# Patient Record
Sex: Male | Born: 2000 | Hispanic: No | Marital: Single
Health system: Southern US, Community
[De-identification: ages and names within clinical notes are randomized; demographics above are authoritative.]

## PROBLEM LIST (undated history)

## (undated) DIAGNOSIS — J45909 Unspecified asthma, uncomplicated: Secondary | ICD-10-CM

## (undated) DIAGNOSIS — T7840XA Allergy, unspecified, initial encounter: Secondary | ICD-10-CM

## (undated) HISTORY — DX: Allergy, unspecified, initial encounter: T78.40XA

## (undated) HISTORY — DX: Unspecified asthma, uncomplicated: J45.909

---
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
## 2019-04-07 ENCOUNTER — Emergency Department (HOSPITAL_COMMUNITY)
Admission: EM | Admit: 2019-04-07 | Discharge: 2019-04-08 | Disposition: A | Discharge status: Home / Self Care | Payer: Medicaid Other | Attending: Emergency Medicine | Admitting: Emergency Medicine

## 2019-04-07 ENCOUNTER — Other Ambulatory Visit: Payer: Self-pay

## 2019-04-07 ENCOUNTER — Encounter (HOSPITAL_COMMUNITY): Payer: Self-pay

## 2019-04-07 DIAGNOSIS — Z79899 Other long term (current) drug therapy: Secondary | ICD-10-CM | POA: Diagnosis not present

## 2019-04-07 DIAGNOSIS — J45909 Unspecified asthma, uncomplicated: Secondary | ICD-10-CM | POA: Diagnosis not present

## 2019-04-07 DIAGNOSIS — Z7722 Contact with and (suspected) exposure to environmental tobacco smoke (acute) (chronic): Secondary | ICD-10-CM | POA: Insufficient documentation

## 2019-04-07 DIAGNOSIS — Z202 Contact with and (suspected) exposure to infections with a predominantly sexual mode of transmission: Secondary | ICD-10-CM | POA: Insufficient documentation

## 2019-04-07 LAB — URINALYSIS, ROUTINE W REFLEX MICROSCOPIC
Bacteria, UA: NONE SEEN
Bilirubin Urine: NEGATIVE
Glucose, UA: NEGATIVE mg/dL
Hgb urine dipstick: NEGATIVE
Ketones, ur: 5 mg/dL — AB
Nitrite: NEGATIVE
Protein, ur: 30 mg/dL — AB
Specific Gravity, Urine: 1.028 (ref 1.005–1.030)
pH: 6 (ref 5.0–8.0)

## 2019-04-07 NOTE — ED Triage Notes (Signed)
Pt states his gf has chlamydia and he wants to be checked

## 2019-04-08 MED ORDER — LIDOCAINE HCL (PF) 1 % IJ SOLN
INTRAMUSCULAR | Status: AC
  Administered 2019-04-08: 0.9 mL
  Filled 2019-04-08: qty 5

## 2019-04-08 MED ORDER — CEFTRIAXONE SODIUM 250 MG IJ SOLR
250.0000 mg | Freq: Once | INTRAMUSCULAR | Status: AC
  Administered 2019-04-08: 250 mg via INTRAMUSCULAR
  Filled 2019-04-08: qty 250

## 2019-04-08 MED ORDER — AZITHROMYCIN 1 G PO PACK
1.0000 g | PACK | Freq: Once | ORAL | Status: AC
  Administered 2019-04-08: 1 g via ORAL
  Filled 2019-04-08: qty 1

## 2019-04-08 NOTE — Discharge Instructions (Signed)
No sexual intercourse for at least 1 week. Please use protection and choose wisely.

## 2019-04-08 NOTE — ED Provider Notes (Signed)
[facility]   CSN: [id number] Arrival date & time: 04/07/19  2151     History   Chief Complaint Chief Complaint  Patient presents with  . Exposure to STD    HPI Brian Kidd is a 18 y.o. male.     Patient with history of asthma presents after his girlfriend was informed she had chlamydia from testing.  Patient denies symptoms at this time.  No history of STDs.  Patient has been with this partner for for significant time.  Male partners.     Past Medical History:  Diagnosis Date  . Allergy   . Asthma     Patient Active Problem List   Diagnosis Date Noted  . Moderate persistent asthma with acute exacerbation 06/19/2014  . Rhinitis, allergic 06/19/2014    History reviewed. No pertinent surgical history.      Home Medications    Prior to Admission medications   Medication Sig Start Date End Date Taking? Authorizing Provider  albuterol (PROVENTIL HFA;VENTOLIN HFA) 108 (90 BASE) MCG/ACT inhaler Inhale 2 puffs into the lungs every 6 (six) hours as needed for wheezing or shortness of breath. 06/19/14   Ettefagh, Aron BabaKate Scott, MD  albuterol (PROVENTIL) (2.5 MG/3ML) 0.083% nebulizer solution Take 3 mLs (2.5 mg total) by nebulization every 4 (four) hours as needed for wheezing or shortness of breath. 06/19/14   Ettefagh, Aron BabaKate Scott, MD  beclomethasone (QVAR) 40 MCG/ACT inhaler Inhale 2 puffs into the lungs 2 (two) times daily. 06/19/14   Ettefagh, Aron BabaKate Scott, MD  cetirizine (ZYRTEC) 10 MG tablet Take 1 tablet (10 mg total) by mouth daily. 06/19/14   Ettefagh, Aron BabaKate Scott, MD  fluticasone (FLONASE) 50 MCG/ACT nasal spray Place 2 sprays into both nostrils daily. 06/19/14   Ettefagh, Aron BabaKate Scott, MD  ibuprofen (ADVIL,MOTRIN) 600 MG tablet Take 1 tablet (600 mg total) by mouth every 6 (six) hours as needed for mild pain or moderate pain. 06/29/16   Lowanda FosterBrewer, Mindy, NP  predniSONE (DELTASONE) 20 MG tablet Take 3 tablets (60 mg total) by  mouth daily with breakfast. For 5 days 06/19/14   Ettefagh, Aron BabaKate Scott, MD    Family History No family history on file.  Social History Social History   Tobacco Use  . Smoking status: Passive Smoke Exposure - Never Smoker  . Smokeless tobacco: Never Used  Substance Use Topics  . Alcohol use: Not on file  . Drug use: Not on file     Allergies   Patient has no known allergies.   Review of Systems Review of Systems  Constitutional: Negative for fever.  Gastrointestinal: Negative for vomiting.  Genitourinary: Negative for discharge, hematuria and penile pain.     Physical Exam Updated Vital Signs BP 130/78 (BP Location: Left Arm)   Pulse 73   Temp 98.4 F (36.9 C) (Oral)   Resp 16   SpO2 100%   Physical Exam Vitals signs and nursing note reviewed.  Constitutional:      Appearance: He is well-developed.  HENT:     Head: Normocephalic.  Eyes:     General:        Right eye: No discharge.        Left eye: No discharge.     Conjunctiva/sclera: Conjunctivae normal.  Neck:     Trachea: No tracheal deviation.  Cardiovascular:     Rate and Rhythm: Normal rate.  Skin:    General: Skin is warm.     Findings: No rash.  Neurological:     Mental Status: He is alert and oriented to person, place, and time.  Psychiatric:        Mood and Affect: Mood normal.      ED Treatments / Results  Labs (all labs ordered are listed, but only abnormal results are displayed) Labs Reviewed  URINALYSIS, ROUTINE W REFLEX MICROSCOPIC - Abnormal; Notable for the following components:      Result Value   Ketones, ur 5 (*)    Protein, ur 30 (*)    Leukocytes,Ua SMALL (*)    All other components within normal limits    EKG None  Radiology No results found.  Procedures Procedures (including critical care time)  Medications Ordered in ED Medications  cefTRIAXone (ROCEPHIN) injection 250 mg (has no administration in time range)  azithromycin (ZITHROMAX) powder 1 g (has no  administration in time range)     Initial Impression / Assessment and Plan / ED Course  I have reviewed the triage vital signs and the nursing notes.  Pertinent labs & imaging results that were available during my care of the patient were reviewed by me and considered in my medical decision making (see chart for details).       Patient presents for treatment of known chlamydia exposure.  Patient well-appearing no signs or symptoms at this time.  Prophylactic antibiotics given, discussed importance of protection and wise decisions.  Recommended no sexual intercourse for at least 1 week.  Final Clinical Impressions(s) / ED Diagnoses   Final diagnoses:  STD exposure    ED Discharge Orders    None       Elnora Morrison, MD 04/08/19 0011

## 2019-04-15 ENCOUNTER — Other Ambulatory Visit: Payer: Self-pay

## 2019-04-15 ENCOUNTER — Emergency Department (HOSPITAL_COMMUNITY)
Admission: EM | Admit: 2019-04-15 | Discharge: 2019-04-15 | Disposition: A | Discharge status: Home / Self Care | Payer: Medicaid Other | Attending: Emergency Medicine | Admitting: Emergency Medicine

## 2019-04-15 ENCOUNTER — Encounter (HOSPITAL_COMMUNITY): Payer: Self-pay | Admitting: Emergency Medicine

## 2019-04-15 DIAGNOSIS — Z7722 Contact with and (suspected) exposure to environmental tobacco smoke (acute) (chronic): Secondary | ICD-10-CM | POA: Insufficient documentation

## 2019-04-15 DIAGNOSIS — Z202 Contact with and (suspected) exposure to infections with a predominantly sexual mode of transmission: Secondary | ICD-10-CM | POA: Diagnosis present

## 2019-04-15 DIAGNOSIS — A749 Chlamydial infection, unspecified: Secondary | ICD-10-CM

## 2019-04-15 DIAGNOSIS — Z8709 Personal history of other diseases of the respiratory system: Secondary | ICD-10-CM | POA: Insufficient documentation

## 2019-04-15 LAB — HIV ANTIBODY (ROUTINE TESTING W REFLEX): HIV Screen 4th Generation wRfx: NONREACTIVE

## 2019-04-15 LAB — RPR: RPR Ser Ql: NONREACTIVE

## 2019-04-15 MED ORDER — AZITHROMYCIN 250 MG PO TABS
1000.0000 mg | ORAL_TABLET | Freq: Once | ORAL | Status: AC
  Administered 2019-04-15: 1000 mg via ORAL
  Filled 2019-04-15: qty 4

## 2019-04-15 MED ORDER — LIDOCAINE HCL (PF) 1 % IJ SOLN
INTRAMUSCULAR | Status: AC
  Filled 2019-04-15: qty 5

## 2019-04-15 MED ORDER — CEFTRIAXONE SODIUM 250 MG IJ SOLR
250.0000 mg | Freq: Once | INTRAMUSCULAR | Status: AC
  Administered 2019-04-15: 05:00:00 250 mg via INTRAMUSCULAR
  Filled 2019-04-15: qty 250

## 2019-04-15 NOTE — ED Triage Notes (Signed)
Pt reports he tested positive for STD, needs treatment.  No symptoms noted

## 2019-04-15 NOTE — Discharge Instructions (Signed)
You have been treated for chlamydia and gonorrhea today.  Both you and your partner need to avoid all sexual intercourse for at least 7 days after both of you have been treated.  We have also tested you for HIV and syphilis.  You can follow-up on these results in my chart.  If they are positive, you will be contacted and your partner will need to be informed.  I recommend that you use condoms every time you are sexually active to prevent STDs and unwanted pregnancy.

## 2019-04-15 NOTE — ED Provider Notes (Signed)
TIME SEEN: 4:07 AM  CHIEF COMPLAINT: Requesting treatment for chlamydia  HPI: Patient is an 18 year old male with history of asthma who presents to the emergency department requesting treatment for chlamydia.  States that he tested positive for chlamydia at the clinic 2 days ago.  He was contacted but did not follow-up with the clinic for treatment.  He is asymptomatic.  He denies abdominal pain, fevers, vomiting, diarrhea, dysuria, hematuria, penile discomfort, testicular pain or scrotal swelling.  States he is sexually active with one male partner.  They do not always use protection.  Patient was seen here on June 10 and was prophylactically treated for gonorrhea and chlamydia after he had a known STD exposure to chlamydia.  He acknowledges that he was told to wait at least 1 week for sexual intercourse but states that they did not wait the full 7 days after both being treated.  States he did not have HIV and syphilis testing at the clinic.  ROS: See HPI Constitutional: no fever  Eyes: no drainage  ENT: no runny nose   Cardiovascular:  no chest pain  Resp: no SOB  GI: no vomiting GU: no dysuria Integumentary: no rash  Allergy: no hives  Musculoskeletal: no leg swelling  Neurological: no slurred speech ROS otherwise negative  PAST MEDICAL HISTORY/PAST SURGICAL HISTORY:  Past Medical History:  Diagnosis Date  . Allergy   . Asthma     MEDICATIONS:  Prior to Admission medications   Medication Sig Start Date End Date Taking? Authorizing Provider  albuterol (PROVENTIL HFA;VENTOLIN HFA) 108 (90 BASE) MCG/ACT inhaler Inhale 2 puffs into the lungs every 6 (six) hours as needed for wheezing or shortness of breath. 06/19/14   Ettefagh, Aron BabaKate Scott, MD  albuterol (PROVENTIL) (2.5 MG/3ML) 0.083% nebulizer solution Take 3 mLs (2.5 mg total) by nebulization every 4 (four) hours as needed for wheezing or shortness of breath. 06/19/14   Ettefagh, Aron BabaKate Scott, MD  beclomethasone (QVAR) 40 MCG/ACT  inhaler Inhale 2 puffs into the lungs 2 (two) times daily. 06/19/14   Ettefagh, Aron BabaKate Scott, MD  cetirizine (ZYRTEC) 10 MG tablet Take 1 tablet (10 mg total) by mouth daily. 06/19/14   Ettefagh, Aron BabaKate Scott, MD  fluticasone (FLONASE) 50 MCG/ACT nasal spray Place 2 sprays into both nostrils daily. 06/19/14   Ettefagh, Aron BabaKate Scott, MD  ibuprofen (ADVIL,MOTRIN) 600 MG tablet Take 1 tablet (600 mg total) by mouth every 6 (six) hours as needed for mild pain or moderate pain. 06/29/16   Lowanda FosterBrewer, Mindy, NP  predniSONE (DELTASONE) 20 MG tablet Take 3 tablets (60 mg total) by mouth daily with breakfast. For 5 days 06/19/14   Ettefagh, Aron BabaKate Scott, MD    ALLERGIES:  No Known Allergies  SOCIAL HISTORY:  Social History   Tobacco Use  . Smoking status: Passive Smoke Exposure - Never Smoker  . Smokeless tobacco: Never Used  Substance Use Topics  . Alcohol use: Not on file    FAMILY HISTORY: No family history on file.  EXAM: BP 127/79 (BP Location: Right Arm)   Pulse 67   Temp 98.5 F (36.9 C) (Oral)   Resp 18   SpO2 97%  CONSTITUTIONAL: Alert and oriented and responds appropriately to questions. Well-appearing; well-nourished HEAD: Normocephalic EYES: Conjunctivae clear, pupils appear equal ENT: normal nose; moist mucous membranes NECK: Supple CARD: RRR RESP: Normal chest excursion without splinting or tachypnea, no hypoxia or respiratory distress, speaking full sentences ABD/GI: Normal bowel sounds; non-distended; soft, non-tender, no rebound, no guarding, no peritoneal signs,  no hepatosplenomegaly GU: Patient deferred BACK:  The back appears normal EXT: Normal ROM in all joints; no major deformity noted SKIN: Normal color for age and race; warm; no rash on exposed skin NEURO: Moves all extremities equally PSYCH: The patient's mood and manner are appropriate. Grooming and personal hygiene are appropriate.  MEDICAL DECISION MAKING: Patient here with STD exposure and positive chlamydia test 2  [facility].  Requesting treatment today.  Discussed with patient given I cannot see those results we will cover him for gonorrhea and chlamydia today.  His girlfriend is currently being seen in the ED for STD exposure as well.  I discussed with him at length that he needs to avoid any sexual intercourse including oral, vaginal or anal intercourse for at least 7 days after both himself and his partner have been treated and that he needs to use condoms on a regular basis.  He states that he already has 1 child and does not want another.  We have had a lengthy discussion regarding safe sex practices.  Have offered HIV and syphilis testing today which she states were not done at the clinic.  He agrees to these blood test.  At this time, I do not feel there is any life-threatening condition present. I have reviewed and discussed all results (EKG, imaging, lab, urine as appropriate) and exam findings with patient/family. I have reviewed nursing notes and appropriate previous records.  I feel the patient is safe to be discharged home without further emergent workup and can continue workup as an outpatient as needed. Discussed usual and customary return precautions. Patient/family verbalize understanding and are comfortable with this plan.  Outpatient follow-up has been provided as needed. All questions have been answered.      Ivan Maskell, Delice Bison, DO 04/15/19 [phone]

## 2019-08-27 ENCOUNTER — Other Ambulatory Visit: Payer: Self-pay

## 2019-08-27 ENCOUNTER — Encounter (HOSPITAL_COMMUNITY): Payer: Self-pay | Admitting: *Deleted

## 2019-08-27 ENCOUNTER — Emergency Department (HOSPITAL_COMMUNITY)
Admission: EM | Admit: 2019-08-27 | Discharge: 2019-08-27 | Disposition: A | Discharge status: Home / Self Care | Payer: Medicaid Other | Attending: Emergency Medicine | Admitting: Emergency Medicine

## 2019-08-27 DIAGNOSIS — Z79899 Other long term (current) drug therapy: Secondary | ICD-10-CM | POA: Diagnosis not present

## 2019-08-27 DIAGNOSIS — Z202 Contact with and (suspected) exposure to infections with a predominantly sexual mode of transmission: Secondary | ICD-10-CM

## 2019-08-27 DIAGNOSIS — Z7722 Contact with and (suspected) exposure to environmental tobacco smoke (acute) (chronic): Secondary | ICD-10-CM | POA: Insufficient documentation

## 2019-08-27 DIAGNOSIS — J45909 Unspecified asthma, uncomplicated: Secondary | ICD-10-CM | POA: Insufficient documentation

## 2019-08-27 MED ORDER — CEFTRIAXONE SODIUM 250 MG IJ SOLR
250.0000 mg | Freq: Once | INTRAMUSCULAR | Status: AC
  Administered 2019-08-27: 250 mg via INTRAMUSCULAR
  Filled 2019-08-27: qty 250

## 2019-08-27 MED ORDER — AZITHROMYCIN 250 MG PO TABS
1000.0000 mg | ORAL_TABLET | Freq: Once | ORAL | Status: AC
  Administered 2019-08-27: 1000 mg via ORAL
  Filled 2019-08-27: qty 4

## 2019-08-27 NOTE — ED Notes (Signed)
Pt sitting texting on his phone  As I triaged him

## 2019-08-27 NOTE — ED Provider Notes (Signed)
[facility] EMERGENCY DEPARTMENT Provider Note   CSN: [id number] Arrival date & time: 08/27/19  1543     History   Chief Complaint Chief Complaint  Patient presents with  . Exposure to STD    HPI Brian Kidd is a 18 y.o. male.     The history is provided by the patient. No language interpreter was used.  Exposure to STD This is a new problem. The problem occurs constantly. The problem has been gradually worsening. Pertinent negatives include no abdominal pain. Nothing aggravates the symptoms. Nothing relieves the symptoms. He has tried nothing for the symptoms. The treatment provided no relief.   Pt reports his partner tested positive for chlamydia.  Pt was told he needed treatment  Past Medical History:  Diagnosis Date  . Allergy   . Asthma     Patient Active Problem List   Diagnosis Date Noted  . Moderate persistent asthma with acute exacerbation 06/19/2014  . Rhinitis, allergic 06/19/2014    History reviewed. No pertinent surgical history.      Home Medications    Prior to Admission medications   Medication Sig Start Date End Date Taking? Authorizing Provider  albuterol (PROVENTIL HFA;VENTOLIN HFA) 108 (90 BASE) MCG/ACT inhaler Inhale 2 puffs into the lungs every 6 (six) hours as needed for wheezing or shortness of breath. 06/19/14   Ettefagh, Aron Baba, MD  albuterol (PROVENTIL) (2.5 MG/3ML) 0.083% nebulizer solution Take 3 mLs (2.5 mg total) by nebulization every 4 (four) hours as needed for wheezing or shortness of breath. 06/19/14   Ettefagh, Aron Baba, MD  beclomethasone (QVAR) 40 MCG/ACT inhaler Inhale 2 puffs into the lungs 2 (two) times daily. 06/19/14   Ettefagh, Aron Baba, MD  cetirizine (ZYRTEC) 10 MG tablet Take 1 tablet (10 mg total) by mouth daily. 06/19/14   Ettefagh, Aron Baba, MD  fluticasone (FLONASE) 50 MCG/ACT nasal spray Place 2 sprays into both nostrils daily. 06/19/14   Ettefagh, Aron Baba, MD  ibuprofen (ADVIL,MOTRIN)  600 MG tablet Take 1 tablet (600 mg total) by mouth every 6 (six) hours as needed for mild pain or moderate pain. 06/29/16   Lowanda Foster, NP  predniSONE (DELTASONE) 20 MG tablet Take 3 tablets (60 mg total) by mouth daily with breakfast. For 5 days 06/19/14   Ettefagh, Aron Baba, MD    Family History No family history on file.  Social History Social History   Tobacco Use  . Smoking status: Passive Smoke Exposure - Never Smoker  . Smokeless tobacco: Never Used  Substance Use Topics  . Alcohol use: Never    Frequency: Never  . Drug use: Yes    Types: Marijuana     Allergies   Patient has no known allergies.   Review of Systems Review of Systems  Gastrointestinal: Negative for abdominal pain.  All other systems reviewed and are negative.    Physical Exam Updated Vital Signs BP 125/83   Pulse 75   Temp 98.1 F (36.7 C) (Oral)   Resp 16   Ht 6\' 3"  (1.905 m)   Wt 77.1 kg   SpO2 99%   BMI 21.25 kg/m   Physical Exam Vitals signs and nursing note reviewed.  Constitutional:      Appearance: He is well-developed.  HENT:     Head: Normocephalic and atraumatic.  Neck:     Musculoskeletal: Neck supple.  Cardiovascular:     Rate and Rhythm: Normal rate.     Heart sounds: No murmur.  Pulmonary:  Effort: Pulmonary effort is normal. No respiratory distress.  Abdominal:     Tenderness: There is no abdominal tenderness.  Skin:    General: Skin is warm and dry.  Neurological:     General: No focal deficit present.     Mental Status: He is alert.  Psychiatric:        Mood and Affect: Mood normal.      ED Treatments / Results  Labs (all labs ordered are listed, but only abnormal results are displayed) Labs Reviewed  URINE CYTOLOGY ANCILLARY ONLY    EKG None  Radiology No results found.  Procedures Procedures (including critical care time)  Medications Ordered in ED Medications  cefTRIAXone (ROCEPHIN) injection 250 mg (has no administration in time  range)  azithromycin (ZITHROMAX) tablet 1,000 mg (has no administration in time range)     Initial Impression / Assessment and Plan / ED Course  I have reviewed the triage vital signs and the nursing notes.  Pertinent labs & imaging results that were available during my care of the patient were reviewed by me and considered in my medical decision making (see chart for details).        MDM  Pt counseled on safe sex.  Pt given zithromax and Rocephin.    Final Clinical Impressions(s) / ED Diagnoses   Final diagnoses:  STD exposure    ED Discharge Orders    None    An After Visit Summary was printed and given to the patient.    Fransico Meadow, Hershal Coria 08/27/19 1649    Blanchie Dessert, MD 08/29/19 2126

## 2019-08-27 NOTE — Discharge Instructions (Signed)
[facility] the [facility]
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
## 2019-08-29 LAB — URINE CYTOLOGY ANCILLARY ONLY
Chlamydia: NEGATIVE
Neisseria Gonorrhea: NEGATIVE

## 2019-09-01 ENCOUNTER — Other Ambulatory Visit: Payer: Self-pay

## 2019-09-01 ENCOUNTER — Other Ambulatory Visit: Payer: Self-pay | Admitting: Physician Assistant

## 2019-09-01 ENCOUNTER — Emergency Department (HOSPITAL_COMMUNITY)
Admission: EM | Admit: 2019-09-01 | Discharge: 2019-09-01 | Disposition: A | Discharge status: Home / Self Care | Payer: Medicaid Other | Attending: Emergency Medicine | Admitting: Emergency Medicine

## 2019-09-01 ENCOUNTER — Encounter (HOSPITAL_COMMUNITY): Payer: Self-pay | Admitting: Emergency Medicine

## 2019-09-01 DIAGNOSIS — Z7689 Persons encountering health services in other specified circumstances: Secondary | ICD-10-CM

## 2019-09-01 DIAGNOSIS — Z202 Contact with and (suspected) exposure to infections with a predominantly sexual mode of transmission: Secondary | ICD-10-CM | POA: Diagnosis not present

## 2019-09-01 DIAGNOSIS — Z7722 Contact with and (suspected) exposure to environmental tobacco smoke (acute) (chronic): Secondary | ICD-10-CM | POA: Insufficient documentation

## 2019-09-01 LAB — URINALYSIS, ROUTINE W REFLEX MICROSCOPIC
Bilirubin Urine: NEGATIVE
Glucose, UA: NEGATIVE mg/dL
Hgb urine dipstick: NEGATIVE
Ketones, ur: NEGATIVE mg/dL
Leukocytes,Ua: NEGATIVE
Nitrite: NEGATIVE
Protein, ur: NEGATIVE mg/dL
Specific Gravity, Urine: 1.025 (ref 1.005–1.030)
pH: 6 (ref 5.0–8.0)

## 2019-09-01 MED ORDER — LIDOCAINE HCL (PF) 1 % IJ SOLN
5.0000 mL | Freq: Once | INTRAMUSCULAR | Status: AC
  Administered 2019-09-01: 5 mL
  Filled 2019-09-01: qty 5

## 2019-09-01 MED ORDER — AZITHROMYCIN 250 MG PO TABS
1000.0000 mg | ORAL_TABLET | Freq: Once | ORAL | Status: AC
  Administered 2019-09-01: 23:00:00 1000 mg via ORAL
  Filled 2019-09-01: qty 4

## 2019-09-01 MED ORDER — METRONIDAZOLE 500 MG PO TABS
2000.0000 mg | ORAL_TABLET | Freq: Once | ORAL | Status: AC
  Administered 2019-09-01: 2000 mg via ORAL
  Filled 2019-09-01: qty 4

## 2019-09-01 MED ORDER — CEFTRIAXONE SODIUM 250 MG IJ SOLR
250.0000 mg | Freq: Once | INTRAMUSCULAR | Status: AC
  Administered 2019-09-01: 23:00:00 250 mg via INTRAMUSCULAR
  Filled 2019-09-01: qty 250

## 2019-09-01 NOTE — ED Notes (Signed)
Patient Alert and oriented to baseline. Stable and ambulatory to baseline. Patient verbalized understanding of the discharge instructions.  Patient belongings were taken by the patient.   

## 2019-09-01 NOTE — ED Provider Notes (Signed)
[facility] EMERGENCY DEPARTMENT Provider Note   CSN: [id number] Arrival date & time: 09/01/19  2145     History   Chief Complaint Chief Complaint  Patient presents with   STD Screening    HPI Brian Kidd is a 18 y.o. male.     HPI   Patient is a 18 year old male with a history of allergies, asthma, who presents the emergency department today for evaluation of exposure to sexually transmitted infection.  States that his sexual partner was recently diagnosed with chlamydia.  He was here in the ED 3 days ago and received treatment however he had intercourse with this person again and did not use protection.  He is asymptomatic at this time and denies any penile discharge, dysuria, frequency, testicular pain, swelling, redness, penile lesions, abdominal pain, nausea, vomiting or fevers.  Past Medical History:  Diagnosis Date   Allergy    Asthma     Patient Active Problem List   Diagnosis Date Noted   Moderate persistent asthma with acute exacerbation 06/19/2014   Rhinitis, allergic 06/19/2014    History reviewed. No pertinent surgical history.      Home Medications    Prior to Admission medications   Medication Sig Start Date End Date Taking? Authorizing Provider  albuterol (PROVENTIL HFA;VENTOLIN HFA) 108 (90 BASE) MCG/ACT inhaler Inhale 2 puffs into the lungs every 6 (six) hours as needed for wheezing or shortness of breath. 06/19/14   Ettefagh, Paul Dykes, MD  albuterol (PROVENTIL) (2.5 MG/3ML) 0.083% nebulizer solution Take 3 mLs (2.5 mg total) by nebulization every 4 (four) hours as needed for wheezing or shortness of breath. 06/19/14   Ettefagh, Paul Dykes, MD  beclomethasone (QVAR) 40 MCG/ACT inhaler Inhale 2 puffs into the lungs 2 (two) times daily. 06/19/14   Ettefagh, Paul Dykes, MD  cetirizine (ZYRTEC) 10 MG tablet Take 1 tablet (10 mg total) by mouth daily. 06/19/14   Ettefagh, Paul Dykes, MD  fluticasone (FLONASE) 50 MCG/ACT nasal spray  Place 2 sprays into both nostrils daily. 06/19/14   Ettefagh, Paul Dykes, MD  ibuprofen (ADVIL,MOTRIN) 600 MG tablet Take 1 tablet (600 mg total) by mouth every 6 (six) hours as needed for mild pain or moderate pain. 06/29/16   Kristen Cardinal, NP  predniSONE (DELTASONE) 20 MG tablet Take 3 tablets (60 mg total) by mouth daily with breakfast. For 5 days 06/19/14   Ettefagh, Paul Dykes, MD    Family History No family history on file.  Social History Social History   Tobacco Use   Smoking status: Passive Smoke Exposure - Never Smoker   Smokeless tobacco: Never Used  Substance Use Topics   Alcohol use: Never    Frequency: Never   Drug use: Yes    Types: Marijuana     Allergies   Patient has no known allergies.   Review of Systems Review of Systems  Constitutional: Negative for fever.  Gastrointestinal: Negative for abdominal pain, diarrhea, nausea and vomiting.  Genitourinary: Negative for discharge, dysuria, frequency, penile swelling, scrotal swelling and testicular pain.  Musculoskeletal: Negative for back pain.  Neurological: Negative for headaches.     Physical Exam Updated Vital Signs BP 119/68 (BP Location: Right Arm)    Pulse 74    Temp 98.4 F (36.9 C) (Oral)    Resp 16    SpO2 99%   Physical Exam Vitals signs and nursing note reviewed.  Constitutional:      Appearance: He is well-developed.  HENT:  Head: Normocephalic and atraumatic.  Eyes:     Conjunctiva/sclera: Conjunctivae normal.  Neck:     Musculoskeletal: Neck supple.  Cardiovascular:     Rate and Rhythm: Normal rate and regular rhythm.     Heart sounds: No murmur.  Pulmonary:     Effort: Pulmonary effort is normal. No respiratory distress.     Breath sounds: Normal breath sounds.  Abdominal:     General: Bowel sounds are normal.     Palpations: Abdomen is soft.     Tenderness: There is no abdominal tenderness. There is no guarding or rebound.  Genitourinary:    Comments: deferred Skin:     General: Skin is warm and dry.  Neurological:     Mental Status: He is alert.      ED Treatments / Results  Labs (all labs ordered are listed, but only abnormal results are displayed) Labs Reviewed  URINALYSIS, ROUTINE W REFLEX MICROSCOPIC  HIV ANTIBODY (ROUTINE TESTING W REFLEX)  RPR  GC/CHLAMYDIA PROBE AMP ([place]) NOT AT [facility]    EKG None  Radiology No results found.  Procedures Procedures (including critical care time)  Medications Ordered in ED Medications  cefTRIAXone (ROCEPHIN) injection 250 mg (has no administration in time range)  azithromycin (ZITHROMAX) tablet 1,000 mg (has no administration in time range)  metroNIDAZOLE (FLAGYL) tablet 2,000 mg (has no administration in time range)     Initial Impression / Assessment and Plan / ED Course  I have reviewed the triage vital signs and the nursing notes.  Pertinent labs & imaging results that were available during my care of the patient were reviewed by me and considered in my medical decision making (see chart for details).   Final Clinical Impressions(s) / ED Diagnoses   Final diagnoses:  Encounter for assessment of STD exposure   Patient is afebrile without abdominal tenderness, abdominal pain or painful bowel movements to indicate prostatitis.  No pain to the testes or epididymis to suggest orchitis or epididymitis.  STD cultures obtained including HIV, syphilis, gonorrhea and chlamydia. Patient to be discharged with instructions to follow up with PCP. Discussed importance of using protection when sexually active. Pt understands that they have GC/Chlamydia cultures pending and that they will need to inform all sexual partners if results return positive. Patient has been treated prophylactically with azithromycin, flagyl and Rocephin.    ED Discharge Orders    None       Rayne Du 09/01/19 2238    Little, Ambrose Finland, MD 09/01/19 2255

## 2019-09-01 NOTE — ED Triage Notes (Signed)
Patient requesting STD screening , reports sexual partner diagnosed with Chlamydia , denies any  symptoms . No fever or chills .

## 2019-09-01 NOTE — Discharge Instructions (Signed)

## 2019-09-02 LAB — RPR: RPR Ser Ql: NONREACTIVE

## 2019-09-02 LAB — HIV ANTIBODY (ROUTINE TESTING W REFLEX): HIV Screen 4th Generation wRfx: NONREACTIVE

## 2019-09-05 LAB — MOLECULAR ANCILLARY ONLY
Chlamydia: NEGATIVE
Comment: NEGATIVE
Comment: NORMAL
Neisseria Gonorrhea: NEGATIVE

## 2020-03-29 ENCOUNTER — Emergency Department (HOSPITAL_COMMUNITY): Payer: Medicaid Other

## 2020-03-29 ENCOUNTER — Encounter (HOSPITAL_COMMUNITY): Payer: Self-pay

## 2020-03-29 ENCOUNTER — Emergency Department (HOSPITAL_COMMUNITY)
Admission: EM | Admit: 2020-03-29 | Discharge: 2020-03-29 | Disposition: A | Discharge status: Home / Self Care | Payer: Medicaid Other | Attending: Emergency Medicine | Admitting: Emergency Medicine

## 2020-03-29 DIAGNOSIS — M79641 Pain in right hand: Secondary | ICD-10-CM | POA: Diagnosis not present

## 2020-03-29 DIAGNOSIS — J45909 Unspecified asthma, uncomplicated: Secondary | ICD-10-CM | POA: Insufficient documentation

## 2020-03-29 NOTE — ED Notes (Addendum)
Pt seen in the lobby as another patient was being discharged stating that he was getting something to eat prior to being placed up for discharge but after being seen by the PA. Has not returned to room, will be discharged out of Epic system. Unable to obtain discharge vitals on patient.

## 2020-03-29 NOTE — Discharge Instructions (Signed)
Apply ice to the right hand to reduce inflammation as well as pain.  You can take Tylenol and ibuprofen for management of your pain.  Follow the instructions on the bottle.  It was a pleasure to meet you.

## 2020-03-29 NOTE — ED Provider Notes (Signed)
[facility] EMERGENCY DEPARTMENT Provider Note   CSN: [id number] Arrival date & time: 03/29/20  1314     History Chief Complaint  Patient presents with  . Hand Pain    Brian Kidd is a 19 y.o. male.  HPI Patient is a 19 year old male with a medical history as noted below.  Patient states 3 to 4 hours ago he became angry and punched a television with a closed right fist.  Reports moderate pain along the right fourth MCP as well as the proximal phalanx of the right fourth digit.  Pain worsens with movement.  No numbness, tingling or weakness.    Past Medical History:  Diagnosis Date  . Allergy   . Asthma     Patient Active Problem List   Diagnosis Date Noted  . Moderate persistent asthma with acute exacerbation 06/19/2014  . Rhinitis, allergic 06/19/2014    History reviewed. No pertinent surgical history.     No family history on file.  Social History   Tobacco Use  . Smoking status: Passive Smoke Exposure - Never Smoker  . Smokeless tobacco: Never Used  Substance Use Topics  . Alcohol use: Never  . Drug use: Yes    Types: Marijuana    Home Medications Prior to Admission medications   Medication Sig Start Date End Date Taking? Authorizing Provider  albuterol (PROVENTIL HFA;VENTOLIN HFA) 108 (90 BASE) MCG/ACT inhaler Inhale 2 puffs into the lungs every 6 (six) hours as needed for wheezing or shortness of breath. Patient not taking: Reported on 03/29/2020 06/19/14   Ettefagh, Aron Baba, MD  albuterol (PROVENTIL) (2.5 MG/3ML) 0.083% nebulizer solution Take 3 mLs (2.5 mg total) by nebulization every 4 (four) hours as needed for wheezing or shortness of breath. Patient not taking: Reported on 03/29/2020 06/19/14   Ettefagh, Aron Baba, MD  beclomethasone (QVAR) 40 MCG/ACT inhaler Inhale 2 puffs into the lungs 2 (two) times daily. Patient not taking: Reported on 03/29/2020 06/19/14   Ettefagh, Aron Baba, MD  cetirizine (ZYRTEC) 10 MG tablet Take 1 tablet  (10 mg total) by mouth daily. Patient not taking: Reported on 03/29/2020 06/19/14   Ettefagh, Aron Baba, MD  fluticasone [facility]) 50 MCG/ACT nasal spray Place 2 sprays into both nostrils daily. Patient not taking: Reported on 03/29/2020 06/19/14   Ettefagh, Aron Baba, MD  ibuprofen (ADVIL,MOTRIN) 600 MG tablet Take 1 tablet (600 mg total) by mouth every 6 (six) hours as needed for mild pain or moderate pain. Patient not taking: Reported on 03/29/2020 06/29/16   Lowanda Foster, NP  predniSONE (DELTASONE) 20 MG tablet Take 3 tablets (60 mg total) by mouth daily with breakfast. For 5 days Patient not taking: Reported on 03/29/2020 06/19/14   Ettefagh, Aron Baba, MD    Allergies    Patient has no known allergies.  Review of Systems   Review of Systems  Musculoskeletal: Positive for arthralgias, joint swelling and myalgias.  Skin: Positive for color change. Negative for wound.  Neurological: Negative for weakness and numbness.    Physical Exam Updated Vital Signs BP (!) 143/92   Pulse 67   Temp 98.3 F (36.8 C) (Oral)   Resp 16   Ht 6\' 2"  (1.88 m)   Wt 74.8 kg   SpO2 100%   BMI 21.18 kg/m   Physical Exam Vitals and nursing note reviewed.  Constitutional:      General: He is not in acute distress.    Appearance: He is well-developed.  HENT:  Head: Normocephalic and atraumatic.     Right Ear: External ear normal.     Left Ear: External ear normal.  Eyes:     General: No scleral icterus.       Right eye: No discharge.        Left eye: No discharge.     Conjunctiva/sclera: Conjunctivae normal.  Neck:     Trachea: No tracheal deviation.  Cardiovascular:     Rate and Rhythm: Normal rate.  Pulmonary:     Effort: Pulmonary effort is normal. No respiratory distress.     Breath sounds: No stridor.  Abdominal:     General: There is no distension.  Musculoskeletal:        General: Swelling and tenderness present. No deformity.     Cervical back: Neck supple.     Comments: Moderate  TTP noted overlying the right fourth MCP and right fourth proximal phalanx.  Full range of motion of the fingers of the right hand.  Palpable radial pulses noted bilaterally.  Patient is neurovascularly intact in the right hand.  Skin:    General: Skin is warm and dry.     Findings: No rash.  Neurological:     Mental Status: He is alert.     Cranial Nerves: Cranial nerve deficit: no gross deficits.    ED Results / Procedures / Treatments   Labs (all labs ordered are listed, but only abnormal results are displayed) Labs Reviewed - No data to display  EKG None  Radiology DG Hand Complete Right  Result Date: 03/29/2020 CLINICAL DATA:  Punching injury. EXAM: RIGHT HAND - COMPLETE 3+ VIEW COMPARISON:  No prior. FINDINGS: No acute bony or joint abnormality. No evidence of fracture or dislocation. No radiopaque foreign body. IMPRESSION: No acute abnormality. Electronically Signed   By: Maisie Fus  Register   On: 03/29/2020 14:06    Procedures Procedures (including critical care time)  Medications Ordered in ED Medications - No data to display  ED Course  I have reviewed the triage vital signs and the nursing notes.  Pertinent labs & imaging results that were available during my care of the patient were reviewed by me and considered in my medical decision making (see chart for details).    MDM Rules/Calculators/A&P                          Patient is a 19 year old male with a history and physical exam as noted above.  X-rays obtained in triage of the right hand which was negative for acute abnormalities.  I discussed with the patient.  Recommended the RICE method.  Use of ibuprofen and Tylenol for pain management.  He understands he can return to the emergency department with any new or worsening symptoms.  His questions were answered and he was amicable at time of discharge.  His vital signs are stable.  Patient discharged to home/self care.  Condition at discharge: Stable  Note:  Portions of this report may have been transcribed using voice recognition software. Every effort was made to ensure accuracy; however, inadvertent computerized transcription errors may be present.    Final Clinical Impression(s) / ED Diagnoses Final diagnoses:  Right hand pain   Rx / DC Orders ED Discharge Orders    None       Placido Sou, PA-C 03/31/20 9767    Vanetta Mulders, MD 04/01/20 1537

## 2020-03-29 NOTE — ED Triage Notes (Signed)
Pt arrives to ED after punching a tv. Pt denies pain.

## 2020-04-26 ENCOUNTER — Encounter (HOSPITAL_COMMUNITY): Payer: Self-pay | Admitting: Emergency Medicine

## 2020-04-26 ENCOUNTER — Other Ambulatory Visit: Payer: Self-pay

## 2020-04-26 ENCOUNTER — Emergency Department (HOSPITAL_COMMUNITY)
Admission: EM | Admit: 2020-04-26 | Discharge: 2020-04-27 | Disposition: A | Discharge status: Home / Self Care | Payer: Medicaid Other | Attending: Emergency Medicine | Admitting: Emergency Medicine

## 2020-04-26 DIAGNOSIS — Z202 Contact with and (suspected) exposure to infections with a predominantly sexual mode of transmission: Secondary | ICD-10-CM | POA: Insufficient documentation

## 2020-04-26 DIAGNOSIS — Z5321 Procedure and treatment not carried out due to patient leaving prior to being seen by health care provider: Secondary | ICD-10-CM | POA: Insufficient documentation

## 2020-04-26 LAB — URINALYSIS, ROUTINE W REFLEX MICROSCOPIC
Bilirubin Urine: NEGATIVE
Glucose, UA: NEGATIVE mg/dL
Hgb urine dipstick: NEGATIVE
Ketones, ur: NEGATIVE mg/dL
Nitrite: NEGATIVE
Protein, ur: 30 mg/dL — AB
Specific Gravity, Urine: 1.033 — ABNORMAL HIGH (ref 1.005–1.030)
pH: 5 (ref 5.0–8.0)

## 2020-04-26 LAB — HIV ANTIBODY (ROUTINE TESTING W REFLEX): HIV Screen 4th Generation wRfx: NONREACTIVE

## 2020-04-26 NOTE — ED Triage Notes (Signed)
Patient reports his partner tested positive for chlamydia. Requesting treatment. Denies S/S

## 2020-04-27 LAB — RPR: RPR Ser Ql: NONREACTIVE

## 2020-04-27 NOTE — ED Notes (Signed)
Pt called for vitals no answer. °

## 2020-04-29 LAB — GC/CHLAMYDIA PROBE AMP (~~LOC~~) NOT AT ARMC
Chlamydia: POSITIVE — AB
Comment: NEGATIVE
Comment: NORMAL
Neisseria Gonorrhea: NEGATIVE

## 2020-04-30 ENCOUNTER — Emergency Department (HOSPITAL_COMMUNITY)
Admission: EM | Admit: 2020-04-30 | Discharge: 2020-04-30 | Disposition: A | Discharge status: Home / Self Care | Payer: Medicaid Other | Attending: Emergency Medicine | Admitting: Emergency Medicine

## 2020-04-30 ENCOUNTER — Other Ambulatory Visit: Payer: Self-pay

## 2020-04-30 ENCOUNTER — Encounter (HOSPITAL_COMMUNITY): Payer: Self-pay | Admitting: Emergency Medicine

## 2020-04-30 DIAGNOSIS — A749 Chlamydial infection, unspecified: Secondary | ICD-10-CM | POA: Diagnosis not present

## 2020-04-30 DIAGNOSIS — Z79899 Other long term (current) drug therapy: Secondary | ICD-10-CM | POA: Insufficient documentation

## 2020-04-30 DIAGNOSIS — Z7722 Contact with and (suspected) exposure to environmental tobacco smoke (acute) (chronic): Secondary | ICD-10-CM | POA: Diagnosis not present

## 2020-04-30 DIAGNOSIS — J45909 Unspecified asthma, uncomplicated: Secondary | ICD-10-CM | POA: Insufficient documentation

## 2020-04-30 DIAGNOSIS — A64 Unspecified sexually transmitted disease: Secondary | ICD-10-CM | POA: Diagnosis present

## 2020-04-30 MED ORDER — DOXYCYCLINE HYCLATE 100 MG PO CAPS: 100.0000 mg | ORAL_CAPSULE | Freq: Two times a day (BID) | ORAL | 0 refills | Status: AC

## 2020-04-30 NOTE — Discharge Instructions (Signed)
Do not have sex until you complete the antibiotics.  Then use protection to prevent future infections.

## 2020-04-30 NOTE — ED Provider Notes (Signed)
[facility] EMERGENCY DEPARTMENT Provider Note   CSN: [id number] Arrival date & time: 04/30/20  1544     History Chief Complaint  Patient presents with  . STD    Brian Kidd is a 19 y.o. male.  Patient is a 19 year old male with a history of asthma who is presenting today requesting treatment for chlamydia.  Patient was tested 3 days ago in the emergency room after one of his partners reported that she tested positive for chlamydia.  Patient denies any dysuria, frequency, urgency or discharge.  He has no complaints at this time.  When they did the testing earlier he did not wait for evaluation because the wait was too long.  He is otherwise been feeling fine.  The history is provided by the patient.       Past Medical History:  Diagnosis Date  . Allergy   . Asthma     Patient Active Problem List   Diagnosis Date Noted  . Moderate persistent asthma with acute exacerbation 06/19/2014  . Rhinitis, allergic 06/19/2014    History reviewed. No pertinent surgical history.     No family history on file.  Social History   Tobacco Use  . Smoking status: Passive Smoke Exposure - Never Smoker  . Smokeless tobacco: Never Used  Substance Use Topics  . Alcohol use: Never  . Drug use: Yes    Types: Marijuana    Home Medications Prior to Admission medications   Medication Sig Start Date End Date Taking? Authorizing Provider  albuterol (PROVENTIL HFA;VENTOLIN HFA) 108 (90 BASE) MCG/ACT inhaler Inhale 2 puffs into the lungs every 6 (six) hours as needed for wheezing or shortness of breath. Patient not taking: Reported on 03/29/2020 06/19/14   Ettefagh, Aron Baba, MD  albuterol (PROVENTIL) (2.5 MG/3ML) 0.083% nebulizer solution Take 3 mLs (2.5 mg total) by nebulization every 4 (four) hours as needed for wheezing or shortness of breath. Patient not taking: Reported on 03/29/2020 06/19/14   Ettefagh, Aron Baba, MD  beclomethasone (QVAR) 40 MCG/ACT inhaler Inhale 2  puffs into the lungs 2 (two) times daily. Patient not taking: Reported on 03/29/2020 06/19/14   Ettefagh, Aron Baba, MD  cetirizine (ZYRTEC) 10 MG tablet Take 1 tablet (10 mg total) by mouth daily. Patient not taking: Reported on 03/29/2020 06/19/14   Ettefagh, Aron Baba, MD  doxycycline (VIBRAMYCIN) 100 MG capsule Take 1 capsule (100 mg total) by mouth 2 (two) times daily. 04/30/20   Gwyneth Sprout, MD  fluticasone (FLONASE) 50 MCG/ACT nasal spray Place 2 sprays into both nostrils daily. Patient not taking: Reported on 03/29/2020 06/19/14   Ettefagh, Aron Baba, MD  ibuprofen (ADVIL,MOTRIN) 600 MG tablet Take 1 tablet (600 mg total) by mouth every 6 (six) hours as needed for mild pain or moderate pain. Patient not taking: Reported on 03/29/2020 06/29/16   Lowanda Foster, NP  predniSONE (DELTASONE) 20 MG tablet Take 3 tablets (60 mg total) by mouth daily with breakfast. For 5 days Patient not taking: Reported on 03/29/2020 06/19/14   Ettefagh, Aron Baba, MD    Allergies    Patient has no known allergies.  Review of Systems   Review of Systems  All other systems reviewed and are negative.   Physical Exam Updated Vital Signs BP 122/67 (BP Location: Right Arm)   Pulse 96   Temp 98.5 F (36.9 C) (Oral)   Resp 15   Ht 6\' 2"  (1.88 m)   Wt 77.1 kg   SpO2 100%  BMI 21.83 kg/m   Physical Exam Vitals and nursing note reviewed.  Constitutional:      General: He is not in acute distress.    Appearance: Normal appearance. He is normal weight.  HENT:     Head: Normocephalic.  Cardiovascular:     Rate and Rhythm: Normal rate.  Pulmonary:     Effort: Pulmonary effort is normal.  Skin:    General: Skin is warm and dry.  Neurological:     General: No focal deficit present.     Mental Status: He is alert and oriented to person, place, and time. Mental status is at baseline.  Psychiatric:        Mood and Affect: Mood normal.        Behavior: Behavior normal.        Thought Content: Thought  content normal.     ED Results / Procedures / Treatments   Labs (all labs ordered are listed, but only abnormal results are displayed) Labs Reviewed - No data to display  EKG None  Radiology No results found.  Procedures Procedures (including critical care time)  Medications Ordered in ED Medications - No data to display  ED Course  I have reviewed the triage vital signs and the nursing notes.  Pertinent labs & imaging results that were available during my care of the patient were reviewed by me and considered in my medical decision making (see chart for details).    MDM Rules/Calculators/A&P                          Patient presenting today requesting treatment for chlamydia.  Patient has not been sexually active since he performed the test 3 days ago.  Testing came back negative for HIV and syphilis.  He was negative for gonorrhea but did test positive for chlamydia.  He is asymptomatic at this time.  Patient was started on doxycycline for 1 week and instructed to avoid sexual contact until he completed treatment and encouraged to wear condoms in the future.   Final Clinical Impression(s) / ED Diagnoses Final diagnoses:  Chlamydia infection    Rx / DC Orders ED Discharge Orders         Ordered    doxycycline (VIBRAMYCIN) 100 MG capsule  2 times daily     Discontinue  Reprint     04/30/20 1737           Gwyneth Sprout, MD 04/30/20 1741

## 2020-04-30 NOTE — ED Triage Notes (Signed)
Pt st's he was called today and told he is + for chlamydia   Pt st's he is here to get treated

## 2020-05-13 ENCOUNTER — Emergency Department (HOSPITAL_COMMUNITY)
Admission: EM | Admit: 2020-05-13 | Discharge: 2020-05-13 | Disposition: A | Discharge status: Home / Self Care | Payer: Medicaid Other | Attending: Emergency Medicine | Admitting: Emergency Medicine

## 2020-05-13 ENCOUNTER — Encounter (HOSPITAL_COMMUNITY): Payer: Self-pay

## 2020-05-13 ENCOUNTER — Other Ambulatory Visit: Payer: Self-pay

## 2020-05-13 DIAGNOSIS — Z202 Contact with and (suspected) exposure to infections with a predominantly sexual mode of transmission: Secondary | ICD-10-CM | POA: Diagnosis not present

## 2020-05-13 DIAGNOSIS — Z5321 Procedure and treatment not carried out due to patient leaving prior to being seen by health care provider: Secondary | ICD-10-CM | POA: Diagnosis not present

## 2020-05-13 NOTE — ED Triage Notes (Signed)
Pt arrives to ED w/ c/o chlamydia symptoms. Pt states he had a positive test recently and was treated w/ abx.

## 2020-05-16 ENCOUNTER — Other Ambulatory Visit: Payer: Self-pay

## 2020-05-16 ENCOUNTER — Emergency Department (HOSPITAL_COMMUNITY)
Admission: EM | Admit: 2020-05-16 | Discharge: 2020-05-16 | Disposition: A | Discharge status: Home / Self Care | Payer: Medicaid Other | Attending: Emergency Medicine | Admitting: Emergency Medicine

## 2020-05-16 DIAGNOSIS — N341 Nonspecific urethritis: Secondary | ICD-10-CM | POA: Diagnosis not present

## 2020-05-16 DIAGNOSIS — R369 Urethral discharge, unspecified: Secondary | ICD-10-CM | POA: Diagnosis present

## 2020-05-16 DIAGNOSIS — N342 Other urethritis: Secondary | ICD-10-CM

## 2020-05-16 DIAGNOSIS — J45909 Unspecified asthma, uncomplicated: Secondary | ICD-10-CM | POA: Insufficient documentation

## 2020-05-16 DIAGNOSIS — Z7722 Contact with and (suspected) exposure to environmental tobacco smoke (acute) (chronic): Secondary | ICD-10-CM | POA: Diagnosis not present

## 2020-05-16 LAB — URINALYSIS, ROUTINE W REFLEX MICROSCOPIC
Bilirubin Urine: NEGATIVE
Glucose, UA: NEGATIVE mg/dL
Hgb urine dipstick: NEGATIVE
Ketones, ur: NEGATIVE mg/dL
Nitrite: NEGATIVE
Protein, ur: 30 mg/dL — AB
Specific Gravity, Urine: 1.021 (ref 1.005–1.030)
WBC, UA: >50 WBC/hpf — ABNORMAL HIGH (ref 0–5)
pH: 7 (ref 5.0–8.0)

## 2020-05-16 LAB — WET PREP, GENITAL
Clue Cells Wet Prep HPF POC: NONE SEEN
Sperm: NONE SEEN
Trich, Wet Prep: NONE SEEN
Yeast Wet Prep HPF POC: NONE SEEN

## 2020-05-16 MED ORDER — CEFTRIAXONE SODIUM 500 MG IJ SOLR
500.0000 mg | Freq: Once | INTRAMUSCULAR | Status: AC
  Administered 2020-05-16: 500 mg via INTRAMUSCULAR
  Filled 2020-05-16: qty 500

## 2020-05-16 MED ORDER — LIDOCAINE HCL (PF) 1 % IJ SOLN
INTRAMUSCULAR | Status: AC
  Administered 2020-05-16: 5 mL
  Filled 2020-05-16: qty 5

## 2020-05-16 MED ORDER — LEVOFLOXACIN 500 MG PO TABS: 500.0000 mg | ORAL_TABLET | Freq: Every day | ORAL | 0 refills | Status: AC

## 2020-05-16 NOTE — ED Triage Notes (Signed)
Pt reports having chlamydia and was tx with abx but is still having discharge and pain with urination.

## 2020-05-16 NOTE — ED Provider Notes (Signed)
[facility] Provider Note   CSN: [id number] Arrival date & time: 05/16/20  1647     History Chief Complaint  Patient presents with  . SEXUALLY TRANSMITTED DISEASE    [facility] is a 19 y.o. male.  HPI 19 year old male presents with penile discharge.  He was treated for chlamydia about 2 weeks ago.  Shortly after starting the doxycycline he developed discharge that was not there.  He states that the discharge has been present along with some dysuria.  No testicle pain or rash.  He has not had intercourse since the chlamydia diagnosis.  He states he tested negative for gonorrhea.  He completed the course of 1 week of doxycycline.   Past Medical History:  Diagnosis Date  . Allergy   . Asthma     Patient Active Problem List   Diagnosis Date Noted  . Moderate persistent asthma with acute exacerbation 06/19/2014  . Rhinitis, allergic 06/19/2014    No past surgical history on file.     No family history on file.  Social History   Tobacco Use  . Smoking status: Passive Smoke Exposure - Never Smoker  . Smokeless tobacco: Never Used  Substance Use Topics  . Alcohol use: Never  . Drug use: Yes    Types: Marijuana    Home Medications Prior to Admission medications   Medication Sig Start Date End Date Taking? Authorizing Provider  albuterol (PROVENTIL HFA;VENTOLIN HFA) 108 (90 BASE) MCG/ACT inhaler Inhale 2 puffs into the lungs every 6 (six) hours as needed for wheezing or shortness of breath. Patient not taking: Reported on 03/29/2020 06/19/14   Ettefagh, Aron Baba, MD  albuterol (PROVENTIL) (2.5 MG/3ML) 0.083% nebulizer solution Take 3 mLs (2.5 mg total) by nebulization every 4 (four) hours as needed for wheezing or shortness of breath. Patient not taking: Reported on 03/29/2020 06/19/14   Ettefagh, Aron Baba, MD  beclomethasone (QVAR) 40 MCG/ACT inhaler Inhale 2 puffs into the lungs 2 (two) times daily. Patient not taking: Reported  on 03/29/2020 06/19/14   Ettefagh, Aron Baba, MD  cetirizine (ZYRTEC) 10 MG tablet Take 1 tablet (10 mg total) by mouth daily. Patient not taking: Reported on 03/29/2020 06/19/14   Ettefagh, Aron Baba, MD  doxycycline (VIBRAMYCIN) 100 MG capsule Take 1 capsule (100 mg total) by mouth 2 (two) times daily. 04/30/20   Gwyneth Sprout, MD  fluticasone (FLONASE) 50 MCG/ACT nasal spray Place 2 sprays into both nostrils daily. Patient not taking: Reported on 03/29/2020 06/19/14   Ettefagh, Aron Baba, MD  ibuprofen (ADVIL,MOTRIN) 600 MG tablet Take 1 tablet (600 mg total) by mouth every 6 (six) hours as needed for mild pain or moderate pain. Patient not taking: Reported on 03/29/2020 06/29/16   Lowanda Foster, NP  levofloxacin (LEVAQUIN) 500 MG tablet Take 1 tablet (500 mg total) by mouth daily. 05/16/20   Pricilla Loveless, MD  predniSONE (DELTASONE) 20 MG tablet Take 3 tablets (60 mg total) by mouth daily with breakfast. For 5 days Patient not taking: Reported on 03/29/2020 06/19/14   Ettefagh, Aron Baba, MD    Allergies    Patient has no known allergies.  Review of Systems   Review of Systems  Genitourinary: Positive for discharge and penile pain. Negative for testicular pain.    Physical Exam Updated Vital Signs BP 131/85 (BP Location: Left Arm)   Pulse (!) 56   Temp 97.6 F (36.4 C) (Oral)   Resp 17   Ht 6\' 2"  (1.88 m)  Wt 77.1 kg   SpO2 100%   BMI 21.83 kg/m   Physical Exam Vitals and nursing note reviewed.  Constitutional:      Appearance: He is well-developed.  HENT:     Head: Normocephalic and atraumatic.     Right Ear: External ear normal.     Left Ear: External ear normal.     Nose: Nose normal.  Eyes:     General:        Right eye: No discharge.        Left eye: No discharge.  Pulmonary:     Effort: Pulmonary effort is normal.  Abdominal:     General: There is no distension.  Genitourinary:    Penis: Discharge (mild) present. No tenderness or lesions.      Testes:         Right: Tenderness or swelling not present.        Left: Tenderness or swelling not present.  Musculoskeletal:     Cervical back: Neck supple.  Skin:    General: Skin is warm and dry.  Neurological:     Mental Status: He is alert.  Psychiatric:        Mood and Affect: Mood is not anxious.     ED Results / Procedures / Treatments   Labs (all labs ordered are listed, but only abnormal results are displayed) Labs Reviewed  WET PREP, GENITAL - Abnormal; Notable for the following components:      Result Value   WBC, Wet Prep HPF POC MANY (*)    All other components within normal limits  URINALYSIS, ROUTINE W REFLEX MICROSCOPIC - Abnormal; Notable for the following components:   APPearance CLOUDY (*)    Protein, ur 30 (*)    Leukocytes,Ua LARGE (*)    WBC, UA >50 (*)    Bacteria, UA RARE (*)    All other components within normal limits  GC/CHLAMYDIA PROBE AMP ([place]) NOT AT [facility]    EKG None  Radiology No results found.  Procedures Procedures (including critical care time)  Medications Ordered in ED Medications  cefTRIAXone (ROCEPHIN) injection 500 mg (500 mg Intramuscular Given 05/16/20 2139)  lidocaine (PF) (XYLOCAINE) 1 % injection (5 mLs  Given 05/16/20 2139)    ED Course  I have reviewed the triage vital signs and the nursing notes.  Pertinent labs & imaging results that were available during my care of the patient were reviewed by me and considered in my medical decision making (see chart for details).    MDM Rules/Calculators/A&P                          Patient is complaining of urethritis.  Wet prep was obtained which does not show trichomonas.  I discussed with pharmacy and they recommend levaquin 500 mg x 7 days.  He was given IM Rocephin in case his gonorrhea was a false negative.  I will refer to infectious disease in case the Levaquin does not improve his symptoms. Retested for GC/chlamydia. Final Clinical Impression(s) / ED Diagnoses Final  diagnoses:  Urethritis    Rx / DC Orders ED Discharge Orders         Ordered    levofloxacin (LEVAQUIN) 500 MG tablet  Daily        05/16/20 2209           Pricilla Loveless, MD 05/16/20 2334

## 2020-05-16 NOTE — ED Notes (Signed)
Patient verbalizes understanding of discharge instructions. Opportunity for questioning and answers were provided. Armband removed by staff, pt discharged from ED stable & ambulatory  

## 2020-05-17 LAB — GC/CHLAMYDIA PROBE AMP (~~LOC~~) NOT AT ARMC
Chlamydia: NEGATIVE
Comment: NEGATIVE
Comment: NORMAL
Neisseria Gonorrhea: POSITIVE — AB

## 2020-05-17 NOTE — ED Provider Notes (Signed)
Chart review shows his gonorrhea is positive. I tried to call patient as at this point he does not seem to need levaquin, the gonorrhea is what appears to be causing his urethritis. I called his phone number but no response from him and his VM is full. If he calls back, he could stop levaquin.    Pricilla Loveless, MD 05/17/20 [phone]

## 2020-06-21 ENCOUNTER — Other Ambulatory Visit: Payer: Self-pay

## 2020-06-21 ENCOUNTER — Encounter (HOSPITAL_COMMUNITY): Payer: Self-pay | Admitting: *Deleted

## 2020-06-21 ENCOUNTER — Emergency Department (HOSPITAL_COMMUNITY)
Admission: EM | Admit: 2020-06-21 | Discharge: 2020-06-22 | Disposition: A | Discharge status: Home / Self Care | Payer: Medicaid Other | Attending: Emergency Medicine | Admitting: Emergency Medicine

## 2020-06-21 DIAGNOSIS — Z5321 Procedure and treatment not carried out due to patient leaving prior to being seen by health care provider: Secondary | ICD-10-CM | POA: Diagnosis not present

## 2020-06-21 DIAGNOSIS — Z202 Contact with and (suspected) exposure to infections with a predominantly sexual mode of transmission: Secondary | ICD-10-CM | POA: Diagnosis not present

## 2020-06-21 NOTE — ED Triage Notes (Signed)
To ED for eval due to exposure to STD. Pt denies symptoms and unknown how long ago he was exposed.

## 2020-06-22 NOTE — ED Notes (Signed)
Pt did not respond when called for vitals check, not seen outside lobby
